# Patient Record
Sex: Male | Born: 1975 | Race: White | Hispanic: No | Marital: Single | Smoking: Current every day smoker
Health system: Southern US, Community
[De-identification: ages and names within clinical notes are randomized; demographics above are authoritative.]

## PROBLEM LIST (undated history)

## (undated) DIAGNOSIS — I10 Essential (primary) hypertension: Secondary | ICD-10-CM

## (undated) HISTORY — PX: FRACTURE SURGERY: SHX138

---

## 2010-08-09 ENCOUNTER — Observation Stay (HOSPITAL_COMMUNITY): Admission: EM | Admit: 2010-08-09 | Discharge: 2010-08-09 | Payer: Self-pay | Admitting: Emergency Medicine

## 2010-08-09 ENCOUNTER — Encounter (INDEPENDENT_AMBULATORY_CARE_PROVIDER_SITE_OTHER): Payer: Self-pay | Admitting: Emergency Medicine

## 2010-08-09 ENCOUNTER — Ambulatory Visit: Payer: Self-pay | Admitting: Internal Medicine

## 2011-02-28 LAB — POCT CARDIAC MARKERS: Troponin i, poc: <0.05 ng/mL (ref 0.00–0.09)

## 2011-02-28 LAB — CBC
HCT: 43.5 % (ref 39.0–52.0)
Hemoglobin: 15 g/dL (ref 13.0–17.0)
MCH: 30.5 pg (ref 26.0–34.0)
MCHC: 34.5 g/dL (ref 30.0–36.0)
MCV: 88.4 fL (ref 78.0–100.0)
Platelets: 206 10*3/uL (ref 150–400)
RBC: 4.92 MIL/uL (ref 4.22–5.81)
RDW: 13.2 % (ref 11.5–15.5)
WBC: 5.9 10*3/uL (ref 4.0–10.5)

## 2011-02-28 LAB — COMPREHENSIVE METABOLIC PANEL
AST: 27 U/L (ref 0–37)
Alkaline Phosphatase: 45 U/L (ref 39–117)
BUN: 4 mg/dL — ABNORMAL LOW (ref 6–23)
CO2: 28 mEq/L (ref 19–32)
Calcium: 8.8 mg/dL (ref 8.4–10.5)
Chloride: 104 mEq/L (ref 96–112)
GFR calc Af Amer: >60 mL/min (ref >60)
Glucose, Bld: 91 mg/dL (ref 70–99)
Sodium: 137 mEq/L (ref 135–145)
Total Bilirubin: 0.3 mg/dL (ref 0.3–1.2)

## 2011-02-28 LAB — DIFFERENTIAL
Basophils Absolute: 0 10*3/uL (ref 0.0–0.1)
Lymphocytes Relative: 31 % (ref 12–46)
Monocytes Absolute: 0.7 10*3/uL (ref 0.1–1.0)

## 2020-05-30 ENCOUNTER — Encounter: Payer: Self-pay | Admitting: Emergency Medicine

## 2020-05-30 ENCOUNTER — Emergency Department (INDEPENDENT_AMBULATORY_CARE_PROVIDER_SITE_OTHER)
Admission: EM | Admit: 2020-05-30 | Discharge: 2020-05-30 | Disposition: A | Discharge status: Home / Self Care | Payer: Managed Care, Other (non HMO) | Source: Home / Self Care

## 2020-05-30 ENCOUNTER — Telehealth: Payer: Self-pay | Admitting: Emergency Medicine

## 2020-05-30 ENCOUNTER — Other Ambulatory Visit: Payer: Self-pay

## 2020-05-30 ENCOUNTER — Emergency Department (INDEPENDENT_AMBULATORY_CARE_PROVIDER_SITE_OTHER): Payer: Managed Care, Other (non HMO)

## 2020-05-30 ENCOUNTER — Encounter: Payer: Self-pay | Admitting: Sports Medicine

## 2020-05-30 DIAGNOSIS — M7021 Olecranon bursitis, right elbow: Secondary | ICD-10-CM

## 2020-05-30 DIAGNOSIS — M25521 Pain in right elbow: Secondary | ICD-10-CM

## 2020-05-30 DIAGNOSIS — M25421 Effusion, right elbow: Secondary | ICD-10-CM | POA: Diagnosis not present

## 2020-05-30 DIAGNOSIS — R030 Elevated blood-pressure reading, without diagnosis of hypertension: Secondary | ICD-10-CM

## 2020-05-30 MED ORDER — MELOXICAM 15 MG PO TBDP: 1.0000 | ORAL_TABLET | Freq: Every day | ORAL | 0 refills | Status: DC | PRN

## 2020-05-30 NOTE — ED Triage Notes (Signed)
Woke up 1 month ago w/ swelling to R elbow- unsure if he hit it or not  OTC meds - Aleve 2 tabs this am at 0830 ROM intact to RUE  No COVID vaccine

## 2020-05-30 NOTE — Discharge Instructions (Signed)
  Call today or tomorrow to schedule a follow up appointment with Sports Medicine for further evaluation and treatment of elbow swelling (bursitis)  Call to schedule a follow up appointment with a primary care provider for monitoring and maintenance of your high blood pressure.   Your blood pressure reading was high today.  That could be due to infection, pain, or anxiety of being in a medical office, however, it is important to follow up with a primary care provider next week for a blood pressure recheck.  If your blood pressure stays elevated and untreated, it can increase your risk of stroke, heart disease including heart attacks, or even kidney failure, which would mean you would need dialysis.  High blood pressure tends to go unnoticed unless you get routine exams as there are typically no side effects until it is too high. High blood pressure can be managed by a low sodium (salt) diet and exercise as well as medication (pills).

## 2020-05-30 NOTE — Telephone Encounter (Signed)
mobic changed to regular tabs not ODT per provider Dario Guardian, PA-C)

## 2020-05-30 NOTE — ED Provider Notes (Signed)
[facility]    CSN: [id number] Arrival date & time: 05/30/20  0936      History   Chief Complaint Chief Complaint  Patient presents with  . Joint Swelling    R elbow    HPI Brandon Hoover is a 44 y.o. male.   HPI  Field Brandon Hoover is a 44 y.o. male presenting to UC with c/o persistent Right elbow swelling and mild soreness that started 1 month ago. 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  He has used an OTC elbow sleeve with padding but no improvement of the swelling.  He has taken Aleve, last dose 830AM.  Denies numbness or weakness in his arm.  Hx of Right elbow surgery many years ago due to a broken arm. He has not had any complications.   BP elevated. He has been told in the past he has high blood pressure but he is not on any BP medications at this time. Denies HA, dizziness or chest pain.    History reviewed. No pertinent past medical history.  There are no problems to display for this patient.   Past Surgical History:  Procedure Laterality Date  . FRACTURE SURGERY Right    elboe w/ pins       Home Medications    Prior to Admission medications   Medication Sig Start Date End Date Taking? Authorizing Provider  Meloxicam 15 MG TBDP Take 1 tablet by mouth daily as needed (pain and swelling). 05/30/20   Noe Gens, PA-C    Family History Family History  Problem Relation Age of Onset  . Healthy Father   . Healthy Brother     Social History Social History   Tobacco Use  . Smoking status: Current Every Day Smoker    Packs/day: 0.50    Years: 25.00    Pack years: 12.50    Types: Cigarettes  . Smokeless tobacco: Never Used  Vaping Use  . Vaping Use: Never used  Substance Use Topics  . Alcohol use: Yes    Alcohol/week: 4.0 standard drinks    Types: 4 Standard drinks or equivalent per week  . Drug use: Never     Allergies   Patient has no known allergies.   Review of  Systems Review of Systems  Constitutional: Negative for chills and fever.  Musculoskeletal: Positive for arthralgias and joint swelling.  Skin: Negative for color change and wound.     Physical Exam Triage Vital Signs ED Triage Vitals  Enc Vitals Group     BP 05/30/20 0951 (!) 175/101     Pulse Rate 05/30/20 0951 77     Resp 05/30/20 0951 17     Temp 05/30/20 0951 98.9 F (37.2 C)     Temp Source 05/30/20 0951 Oral     SpO2 05/30/20 0951 100 %     Weight 05/30/20 0955 230 lb (104.3 kg)     Height 05/30/20 0955 6\' 1"  (1.854 m)     Head Circumference --      Peak Flow --      Pain Score 05/30/20 0955 2     Pain Loc --      Pain Edu? --      Excl. in [place]? --    No data found.  Updated Vital Signs BP (!) 151/95 (BP Location: Left Arm)   Pulse 88   Temp 98.9 F (37.2 C) (Oral)  Resp 17   Ht 6\' 1"  (1.854 m)   Wt 230 lb (104.3 kg)   SpO2 100%   BMI 30.34 kg/m   Visual Acuity Right Eye Distance:   Left Eye Distance:   Bilateral Distance:    Right Eye Near:   Left Eye Near:    Bilateral Near:     Physical Exam Vitals and nursing note reviewed.  Constitutional:      Appearance: Normal appearance. He is well-developed.  HENT:     Head: Normocephalic and atraumatic.  Cardiovascular:     Rate and Rhythm: Normal rate and regular rhythm.     Pulses:          Radial pulses are 2+ on the right side.  Pulmonary:     Effort: Pulmonary effort is normal.  Musculoskeletal:        General: Swelling and tenderness present. Normal range of motion.     Cervical back: Normal range of motion.     Comments: Right elbow: moderate to significant edema of olecranon bursa. Mildly tender. Full ROM elbow. No crepitus. 5/5 strength in Right arm  Skin:    General: Skin is warm and dry.     Capillary Refill: Capillary refill takes less than 2 seconds.     Findings: No bruising or erythema.     Comments: Right elbow: well healed surgical scar along lateral epicondyle   Neurological:      Mental Status: He is alert and oriented to person, place, and time.     Sensory: No sensory deficit.  Psychiatric:        Behavior: Behavior normal.      UC Treatments / Results  Labs (all labs ordered are listed, but only abnormal results are displayed) Labs Reviewed - No data to display  EKG   Radiology DG ELBOW COMPLETE RIGHT (3+VIEW)  Result Date: 05/30/2020 CLINICAL DATA:  Right elbow pain/swelling, injury at work 1 month ago EXAM: RIGHT ELBOW - COMPLETE 3+ VIEW COMPARISON:  None. FINDINGS: No fracture or dislocation is seen. The joint spaces are preserved. No displaced elbow joint fat pads suggest an elbow joint effusion. Soft tissue swelling overlying the olecranon on the lateral view, suggesting olecranon bursitis. IMPRESSION: Soft tissue swelling overlying the olecranon on the lateral view, suggesting olecranon bursitis. Electronically Signed   By: 06/01/2020 M.D.   On: 05/30/2020 10:33    Procedures Procedures (including critical care time)  Medications Ordered in UC Medications - No data to display  Initial Impression / Assessment and Plan / UC Course  I have reviewed the triage vital signs and the nursing notes.  Pertinent labs & imaging results that were available during my care of the patient were reviewed by me and considered in my medical decision making (see chart for details).     Olecranon bursitis w/o complication Encouraged f/u with  Sports Medicine for elbow and PCP for BP management AVS provided  Final Clinical Impressions(s) / UC Diagnoses   Final diagnoses:  Olecranon bursitis of right elbow  Elevated blood pressure reading     Discharge Instructions      Call today or tomorrow to schedule a follow up appointment with Sports Medicine for further evaluation and treatment of elbow swelling (bursitis)  Call to schedule a follow up appointment with a primary care provider for monitoring and maintenance of your high blood pressure.     Your blood pressure reading was high today.  That could be due to infection, pain, or  anxiety of being in a medical office, however, it is important to follow up with a primary care provider next week for a blood pressure recheck.  If your blood pressure stays elevated and untreated, it can increase your risk of stroke, heart disease including heart attacks, or even kidney failure, which would mean you would need dialysis.  High blood pressure tends to go unnoticed unless you get routine exams as there are typically no side effects until it is too high. High blood pressure can be managed by a low sodium (salt) diet and exercise as well as medication (pills).     ED Prescriptions    Medication Sig Dispense Auth. Provider   Meloxicam 15 MG TBDP Take 1 tablet by mouth daily as needed (pain and swelling). 30 tablet Lurene Shadow, [place]     PDMP not reviewed this encounter.   Lurene Shadow, [place] 05/30/20 1132

## 2020-06-05 ENCOUNTER — Ambulatory Visit (INDEPENDENT_AMBULATORY_CARE_PROVIDER_SITE_OTHER): Payer: Managed Care, Other (non HMO) | Admitting: Sports Medicine

## 2020-06-05 DIAGNOSIS — M7021 Olecranon bursitis, right elbow: Secondary | ICD-10-CM

## 2020-06-05 NOTE — Assessment & Plan Note (Signed)
This is a pleasant 44 year old male, he has a classic olecranon bursitis, right elbow. The appearance does bother him, he does have some discomfort as well, but does have history of a right elbow ORIF. Today we aspirated, injected the olecranon bursitis, return to see me in a month.

## 2020-06-05 NOTE — Progress Notes (Signed)
° ° °  Procedures performed today:    Procedure: Real-time Ultrasound Guided aspiration/injection of right olecranon bursa Device: Samsung HS60  Verbal informed consent obtained.  Time-out conducted.  Noted no overlying erythema, induration, or other signs of local infection.  Skin prepped in a sterile fashion.  Local anesthesia: Topical Ethyl chloride.  With sterile technique and under real time ultrasound guidance:  Using an 18-gauge needle aspirated approximately 10 mL of serosanguineous fluid, syringe switched and 1 cc Kenalog 40 injected easily  completed without difficulty  Pain immediately resolved suggesting accurate placement of the medication.  Advised to call if fevers/chills, erythema, induration, drainage, or persistent bleeding.  Images permanently stored and available for review in the ultrasound unit.  Impression: Technically successful ultrasound guided injection.  The elbow was then strapped with a compressive dressing.  Independent interpretation of notes and tests performed by another provider:   None.  Brief History, Exam, Impression, and Recommendations:    Olecranon bursitis, right elbow This is a pleasant 44 year old male, he has a classic olecranon bursitis, right elbow. The appearance does bother him, he does have some discomfort as well, but does have history of a right elbow ORIF. Today we aspirated, injected the olecranon bursitis, return to see me in a month.    ___________________________________________ Ihor Austin. Benjamin Stain, M.D., ABFM., CAQSM. Primary Care and Sports Medicine [facility]  Adjunct Instructor of Family Medicine  [employer]

## 2020-06-07 ENCOUNTER — Encounter: Payer: Self-pay | Admitting: Family Medicine

## 2020-06-07 ENCOUNTER — Other Ambulatory Visit: Payer: Self-pay

## 2020-06-07 ENCOUNTER — Ambulatory Visit (INDEPENDENT_AMBULATORY_CARE_PROVIDER_SITE_OTHER): Payer: Managed Care, Other (non HMO) | Admitting: Family Medicine

## 2020-06-07 VITALS — BP 151/99 | HR 78 | Temp 97.9°F | Ht 72.05 in | Wt 231.0 lb

## 2020-06-07 DIAGNOSIS — I10 Essential (primary) hypertension: Secondary | ICD-10-CM | POA: Diagnosis not present

## 2020-06-07 MED ORDER — OLMESARTAN MEDOXOMIL 20 MG PO TABS: 20.0000 mg | ORAL_TABLET | Freq: Every day | ORAL | 1 refills | Status: DC

## 2020-06-07 NOTE — Progress Notes (Signed)
Brandon Hoover - 44 y.o. male MRN [id number]  Date of birth: 03/17/1976  Subjective Chief Complaint  Patient presents with  . Establish Care  . Hypertension    HPI Brandon Hoover is a  44 y.o. male here today for initial visit.  He was seen at urgent care recently for swelling of elbow and noted to have elevated BP.  He was referred to sports medicine where he was seen by Dr. Darene Lamer and had elbow drained.  He was also advised to follow up with primary care to discuss elevated BP.  HE denies any symptoms related to HTN including chest pain, shortness of breath, palpitations, headache or vision changes.    He is a current smoker, smokes about 1ppd.  He also consumes EtOH a few days per week.  He does add salt when cooking.  He denies increased stress or anxiety.    ROS:  A comprehensive ROS was completed and negative except as noted per HPI  No Known Allergies  History reviewed. No pertinent past medical history.  Past Surgical History:  Procedure Laterality Date  . FRACTURE SURGERY Right    elboe w/ pins    Social History   Socioeconomic History  . Marital status: Single    Spouse name: Not on file  . Number of children: Not on file  . Years of education: Not on file  . Highest education level: Not on file  Occupational History  . Not on file  Tobacco Use  . Smoking status: Current Every Day Smoker    Packs/day: 0.50    Years: 25.00    Pack years: 12.50    Types: Cigarettes  . Smokeless tobacco: Never Used  Vaping Use  . Vaping Use: Never used  Substance and Sexual Activity  . Alcohol use: Yes    Alcohol/week: 4.0 standard drinks    Types: 4 Standard drinks or equivalent per week  . Drug use: Never  . Sexual activity: Yes  Other Topics Concern  . Not on file  Social History Narrative  . Not on file   Social Determinants of Health   Financial Resource Strain:   . Difficulty of Paying Living Expenses:   Food Insecurity:   . Worried About Charity fundraiser in the Last  Year:   . Arboriculturist in the Last Year:   Transportation Needs:   . Film/video editor (Medical):   Marland Kitchen Lack of Transportation (Non-Medical):   Physical Activity:   . Days of Exercise per Week:   . Minutes of Exercise per Session:   Stress:   . Feeling of Stress :   Social Connections:   . Frequency of Communication with Friends and Family:   . Frequency of Social Gatherings with Friends and Family:   . Attends Religious Services:   . Active Member of Clubs or Organizations:   . Attends Archivist Meetings:   Marland Kitchen Marital Status:     Family History  Problem Relation Age of Onset  . Healthy Father   . Healthy Brother     Health Maintenance  Topic Date Due  . Hepatitis C Screening  Never done  . COVID-19 Vaccine (1) Never done  . HIV Screening  Never done  . TETANUS/TDAP  Never done  . INFLUENZA VACCINE  07/15/2020     ----------------------------------------------------------------------------------------------------------------------------------------------------------------------------------------------------------------- Physical Exam BP (!) 151/99 (BP Location: Left Arm, Patient Position: Sitting, Cuff Size: Large)   Pulse 78   Temp 97.9 F (36.6 C)  Ht 6' 0.05" (1.83 m)   Wt 231 lb (104.8 kg)   SpO2 97%   BMI 31.29 kg/m   Physical Exam Constitutional:      Appearance: Normal appearance.  HENT:     Head: Normocephalic and atraumatic.  Eyes:     General: No scleral icterus. Cardiovascular:     Rate and Rhythm: Normal rate and regular rhythm.  Pulmonary:     Effort: Pulmonary effort is normal.     Breath sounds: Normal breath sounds.  Musculoskeletal:     Cervical back: Neck supple.  Skin:    General: Skin is warm and dry.  Neurological:     General: No focal deficit present.     Mental Status: He is alert.  Psychiatric:        Mood and Affect: Mood normal.      ------------------------------------------------------------------------------------------------------------------------------------------------------------------------------------------------------------------- Assessment and Plan  Essential hypertension New diagnosis.  Discussed lifestyle change to help with BP control including lowering sodium intake and quitting smoking.  Start benicar 20mg  daily and cmp/cbc ordered.  Follow up in 6 weeks.    Meds ordered this encounter  Medications  . olmesartan (BENICAR) 20 MG tablet    Sig: Take 1 tablet (20 mg total) by mouth daily.    Dispense:  90 tablet    Refill:  1    Return in about 6 weeks (around 07/19/2020) for HTN.    This visit occurred during the SARS-CoV-2 public health emergency.  Safety protocols were in place, including screening questions prior to the visit, additional usage of staff PPE, and extensive cleaning of exam room while observing appropriate contact time as indicated for disinfecting solutions.

## 2020-06-07 NOTE — Patient Instructions (Signed)

## 2020-06-07 NOTE — Assessment & Plan Note (Signed)
New diagnosis.  Discussed lifestyle change to help with BP control including lowering sodium intake and quitting smoking.  Start benicar 20mg  daily and cmp/cbc ordered.  Follow up in 6 weeks.

## 2020-06-08 LAB — COMPLETE METABOLIC PANEL WITH GFR
AG Ratio: 2 (calc) (ref 1.0–2.5)
ALT: 14 U/L (ref 9–46)
AST: 18 U/L (ref 10–40)
Albumin: 4.5 g/dL (ref 3.6–5.1)
Alkaline phosphatase (APISO): 60 U/L (ref 36–130)
BUN: 16 mg/dL (ref 7–25)
CO2: 27 mmol/L (ref 20–32)
Calcium: 9.7 mg/dL (ref 8.6–10.3)
Chloride: 105 mmol/L (ref 98–110)
Creat: 0.77 mg/dL (ref 0.60–1.35)
GFR, Est African American: 130 mL/min/{1.73_m2} (ref >=60)
GFR, Est Non African American: 112 mL/min/{1.73_m2} (ref >=60)
Globulin: 2.2 g/dL (calc) (ref 1.9–3.7)
Glucose, Bld: 85 mg/dL (ref 65–99)
Potassium: 4.7 mmol/L (ref 3.5–5.3)
Sodium: 139 mmol/L (ref 135–146)
Total Bilirubin: 0.3 mg/dL (ref 0.2–1.2)
Total Protein: 6.7 g/dL (ref 6.1–8.1)

## 2020-06-08 LAB — CBC WITH DIFFERENTIAL/PLATELET
Absolute Monocytes: 854 cells/uL (ref 200–950)
Basophils Absolute: 18 cells/uL (ref 0–200)
Basophils Relative: 0.2 %
Eosinophils Absolute: 27 cells/uL (ref 15–500)
Eosinophils Relative: 0.3 %
HCT: 44.4 % (ref 38.5–50.0)
Hemoglobin: 15.7 g/dL (ref 13.2–17.1)
Lymphs Abs: 1727 cells/uL (ref 850–3900)
MCH: 32.1 pg (ref 27.0–33.0)
MCHC: 35.4 g/dL (ref 32.0–36.0)
MCV: 90.8 fL (ref 80.0–100.0)
MPV: 10.1 fL (ref 7.5–12.5)
Monocytes Relative: 9.6 %
Neutro Abs: 6275 cells/uL (ref 1500–7800)
Neutrophils Relative %: 70.5 %
Platelets: 246 10*3/uL (ref 140–400)
RBC: 4.89 10*6/uL (ref 4.20–5.80)
RDW: 12.6 % (ref 11.0–15.0)
Total Lymphocyte: 19.4 %
WBC: 8.9 10*3/uL (ref 3.8–10.8)

## 2020-07-03 ENCOUNTER — Encounter: Payer: Self-pay | Admitting: Family Medicine

## 2020-07-03 ENCOUNTER — Ambulatory Visit (INDEPENDENT_AMBULATORY_CARE_PROVIDER_SITE_OTHER): Payer: Managed Care, Other (non HMO) | Admitting: Sports Medicine

## 2020-07-03 ENCOUNTER — Ambulatory Visit (INDEPENDENT_AMBULATORY_CARE_PROVIDER_SITE_OTHER): Payer: Managed Care, Other (non HMO) | Admitting: Family Medicine

## 2020-07-03 DIAGNOSIS — M7021 Olecranon bursitis, right elbow: Secondary | ICD-10-CM

## 2020-07-03 DIAGNOSIS — R1013 Epigastric pain: Secondary | ICD-10-CM | POA: Diagnosis not present

## 2020-07-03 DIAGNOSIS — I10 Essential (primary) hypertension: Secondary | ICD-10-CM

## 2020-07-03 MED ORDER — OLMESARTAN MEDOXOMIL 20 MG PO TABS: 10.0000 mg | ORAL_TABLET | Freq: Every day | ORAL | 1 refills | Status: AC

## 2020-07-03 MED ORDER — PANTOPRAZOLE SODIUM 40 MG PO TBEC: DELAYED_RELEASE_TABLET | ORAL | 3 refills | Status: AC

## 2020-07-03 NOTE — Patient Instructions (Addendum)
Cut back to 1/2 tab of the olmesartan.  Start pantoprazole twice per day for 1 month then reduce to once per day.  Stay away from goody powders.  See me again in about 2 months.

## 2020-07-03 NOTE — Assessment & Plan Note (Addendum)
This is a very pleasant 44 year old male, at the last visit we aspirated and injected a right olecranon bursitis, he returns today with symptoms essentially completely resolved, he will continue to wear elbow protection when working, return as needed.

## 2020-07-03 NOTE — Progress Notes (Signed)
    Procedures performed today:    None.  Independent interpretation of notes and tests performed by another provider:   None.  Brief History, Exam, Impression, and Recommendations:    Olecranon bursitis, right elbow This is a very pleasant 44 year old male, at the last visit we aspirated and injected a right olecranon bursitis, he returns today with symptoms essentially completely resolved, he will continue to wear elbow protection when working, return as needed.    ___________________________________________ Ihor Austin. Benjamin Stain, M.D., ABFM., CAQSM. Primary Care and Sports Medicine [facility]  Adjunct Instructor of Family Medicine  [employer]

## 2020-07-03 NOTE — Assessment & Plan Note (Signed)
BP improved today.  It sounds like olmesartan 20mg  may be too high for him.  Will reduce to 1/2 tab daily.  He will continue lifestyle change with sodium reduction.  Encouraged smoking cessation as well.

## 2020-07-03 NOTE — Assessment & Plan Note (Signed)
Symptoms concerning for peptic ulcer.  Will start protonix bid x4 weeks then reduce to daily.  He will avoid goody powder and other NSAIDS for now to allow for healing.  We discussed red flags including worsening pain, nausea, hematemesis or dark stools.

## 2020-07-03 NOTE — Progress Notes (Signed)
Brandon Hoover - 44 y.o. male MRN [id number]  Date of birth: May 21, 1976  Subjective Chief Complaint  Patient presents with  . Hypertension    HPI Brandon Hoover is a 44 y.o. male with history of HTN here today for follow up visit.  He was started on olmesartan 20mg  at last visit.  He has taken a few times but states that he feels funny and a little dizzy after taking.  He last too about 1 week ago.  He has been working on reduction of salt intake.  He denies symptoms related to HTN including chest pain, shortness of breath, palpitations., headache or vision changes.   He also reports some recent epigastric pain.  Describes as burning sensation.  Had some red material in stool last week but thinks this is from red velvet cake.  He has not seen anything since then.  He admits to using goody powders frequently, he has cut back on these recently.    ROS:  A comprehensive ROS was completed and negative except as noted per HPI  No Known Allergies  History reviewed. No pertinent past medical history.  Past Surgical History:  Procedure Laterality Date  . FRACTURE SURGERY Right    elboe w/ pins    Social History   Socioeconomic History  . Marital status: Single    Spouse name: Not on file  . Number of children: Not on file  . Years of education: Not on file  . Highest education level: Not on file  Occupational History  . Not on file  Tobacco Use  . Smoking status: Current Every Day Smoker    Packs/day: 0.50    Years: 25.00    Pack years: 12.50    Types: Cigarettes  . Smokeless tobacco: Never Used  Vaping Use  . Vaping Use: Never used  Substance and Sexual Activity  . Alcohol use: Yes    Alcohol/week: 4.0 standard drinks    Types: 4 Standard drinks or equivalent per week  . Drug use: Never  . Sexual activity: Yes  Other Topics Concern  . Not on file  Social History Narrative  . Not on file   Social Determinants of Health   Financial Resource Strain:   . Difficulty of Paying  Living Expenses:   Food Insecurity:   . Worried About in the Last Year:   . Programme researcher, broadcasting/film/video in the Last Year:   Transportation Needs:   . Barista (Medical):   Freight forwarder Lack of Transportation (Non-Medical):   Physical Activity:   . Days of Exercise per Week:   . Minutes of Exercise per Session:   Stress:   . Feeling of Stress :   Social Connections:   . Frequency of Communication with Friends and Family:   . Frequency of Social Gatherings with Friends and Family:   . Attends Religious Services:   . Active Member of Clubs or Organizations:   . Attends Marland Kitchen Meetings:   Banker Marital Status:     Family History  Problem Relation Age of Onset  . Healthy Father   . Healthy Brother     Health Maintenance  Topic Date Due  . Hepatitis C Screening  Never done  . COVID-19 Vaccine (1) Never done  . HIV Screening  Never done  . TETANUS/TDAP  Never done  . INFLUENZA VACCINE  07/15/2020     ----------------------------------------------------------------------------------------------------------------------------------------------------------------------------------------------------------------- Physical Exam BP 136/87 (BP Location: Left Arm, Patient Position: Sitting, Cuff  Size: Normal)   Pulse 88   Ht 6\' 1"  (1.854 m)   Wt 230 lb (104.3 kg)   SpO2 98%   BMI 30.34 kg/m   Physical Exam Constitutional:      Appearance: Normal appearance.  HENT:     Head: Normocephalic and atraumatic.  Eyes:     General: No scleral icterus. Cardiovascular:     Rate and Rhythm: Normal rate and regular rhythm.  Pulmonary:     Effort: Pulmonary effort is normal.     Breath sounds: Normal breath sounds.  Abdominal:     General: Abdomen is flat. There is no distension.     Palpations: Abdomen is soft.     Tenderness: There is no abdominal tenderness.  Musculoskeletal:     Cervical back: Neck supple.  Skin:    General: Skin is warm.  Neurological:      General: No focal deficit present.     Mental Status: He is alert.  Psychiatric:        Mood and Affect: Mood normal.        Behavior: Behavior normal.     ------------------------------------------------------------------------------------------------------------------------------------------------------------------------------------------------------------------- Assessment and Plan  Epigastric pain Symptoms concerning for peptic ulcer.  Will start protonix bid x4 weeks then reduce to daily.  He will avoid goody powder and other NSAIDS for now to allow for healing.  We discussed red flags including worsening pain, nausea, hematemesis or dark stools.   Essential hypertension BP improved today.  It sounds like olmesartan 20mg  may be too high for him.  Will reduce to 1/2 tab daily.  He will continue lifestyle change with sodium reduction.  Encouraged smoking cessation as well.    Meds ordered this encounter  Medications  . olmesartan (BENICAR) 20 MG tablet    Sig: Take 0.5 tablets (10 mg total) by mouth daily.    Dispense:  90 tablet    Refill:  1  . pantoprazole (PROTONIX) 40 MG tablet    Sig: Take Bid x4 weeks then reduce to once daily    Dispense:  60 tablet    Refill:  3    Return in about 2 months (around 09/03/2020) for HTN.    This visit occurred during the SARS-CoV-2 public health emergency.  Safety protocols were in place, including screening questions prior to the visit, additional usage of staff PPE, and extensive cleaning of exam room while observing appropriate contact time as indicated for disinfecting solutions.

## 2020-09-03 ENCOUNTER — Ambulatory Visit: Payer: Managed Care, Other (non HMO) | Admitting: Family Medicine

## 2021-05-19 ENCOUNTER — Emergency Department (HOSPITAL_BASED_OUTPATIENT_CLINIC_OR_DEPARTMENT_OTHER)
Admission: EM | Admit: 2021-05-19 | Discharge: 2021-05-19 | Disposition: A | Discharge status: Home / Self Care | Payer: 59 | Attending: Emergency Medicine | Admitting: Emergency Medicine

## 2021-05-19 ENCOUNTER — Other Ambulatory Visit: Payer: Self-pay

## 2021-05-19 ENCOUNTER — Emergency Department (HOSPITAL_BASED_OUTPATIENT_CLINIC_OR_DEPARTMENT_OTHER): Payer: 59

## 2021-05-19 ENCOUNTER — Encounter (HOSPITAL_BASED_OUTPATIENT_CLINIC_OR_DEPARTMENT_OTHER): Payer: Self-pay

## 2021-05-19 DIAGNOSIS — F17210 Nicotine dependence, cigarettes, uncomplicated: Secondary | ICD-10-CM | POA: Diagnosis not present

## 2021-05-19 DIAGNOSIS — Z23 Encounter for immunization: Secondary | ICD-10-CM | POA: Insufficient documentation

## 2021-05-19 DIAGNOSIS — I10 Essential (primary) hypertension: Secondary | ICD-10-CM | POA: Insufficient documentation

## 2021-05-19 DIAGNOSIS — Z79899 Other long term (current) drug therapy: Secondary | ICD-10-CM | POA: Insufficient documentation

## 2021-05-19 DIAGNOSIS — W3400XA Accidental discharge from unspecified firearms or gun, initial encounter: Secondary | ICD-10-CM | POA: Diagnosis not present

## 2021-05-19 DIAGNOSIS — S61216A Laceration without foreign body of right little finger without damage to nail, initial encounter: Secondary | ICD-10-CM | POA: Diagnosis not present

## 2021-05-19 DIAGNOSIS — S61206A Unspecified open wound of right little finger without damage to nail, initial encounter: Secondary | ICD-10-CM | POA: Diagnosis present

## 2021-05-19 MED ORDER — TETANUS-DIPHTH-ACELL PERTUSSIS 5-2.5-18.5 LF-MCG/0.5 IM SUSY
0.5000 mL | PREFILLED_SYRINGE | Freq: Once | INTRAMUSCULAR | Status: AC
  Administered 2021-05-19: 0.5 mL via INTRAMUSCULAR
  Filled 2021-05-19: qty 0.5
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
## 2021-05-19 NOTE — ED Notes (Signed)
EDP at bedside  
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
## 2021-05-20 ENCOUNTER — Telehealth: Payer: Self-pay | Admitting: General Practice

## 2021-05-20 NOTE — Telephone Encounter (Signed)
Transition Care Management Unsuccessful Follow-up Telephone Call  Date of discharge and from where:  Time 05/19/21  Attempts:  1st Attempt  Reason for unsuccessful TCM follow-up call:  Left voice message

## 2021-05-21 NOTE — Telephone Encounter (Signed)
Transition Care Management Follow-up Telephone Call  Date of discharge and from where: 05/19/2021 from [facility]  How have you been since you were released from the hospital? Pt stated that he is feeling well and has no questions or concerns.   Any questions or concerns? No  Items Reviewed:  Did the pt receive and understand the discharge instructions provided? Yes   Medications obtained and verified? Yes   Other? No   Any new allergies since your discharge? No   Dietary orders reviewed? n/a  Do you have support at home? Yes   Functional Questionnaire: (I = Independent and D = Dependent) ADLs: I  Bathing/Dressing- I  Meal Prep- I  Eating- I  Maintaining continence- I  Transferring/Ambulation- I  Managing Meds- I  Follow up appointments reviewed:   PCP Hospital f/u appt confirmed? No    Specialist Hospital f/u appt confirmed? No    Are transportation arrangements needed? No   If their condition worsens, is the pt aware to call PCP or go to the Emergency Dept.? Yes  Was the patient provided with contact information for the PCP's office or ED? Yes  Was to pt encouraged to call back with questions or concerns? Yes

## 2021-07-09 ENCOUNTER — Other Ambulatory Visit: Payer: Self-pay | Admitting: Family Medicine

## 2022-04-05 ENCOUNTER — Telehealth: Payer: Self-pay | Admitting: Family Medicine

## 2022-04-05 ENCOUNTER — Emergency Department
Admission: RE | Admit: 2022-04-05 | Discharge: 2022-04-05 | Disposition: A | Discharge status: Home / Self Care | Payer: No Typology Code available for payment source | Source: Ambulatory Visit

## 2022-04-05 VITALS — BP 149/100 | HR 78 | Temp 97.6°F | Resp 20 | Ht 74.0 in | Wt 230.0 lb

## 2022-04-05 DIAGNOSIS — I10 Essential (primary) hypertension: Secondary | ICD-10-CM | POA: Diagnosis not present

## 2022-04-05 DIAGNOSIS — Z760 Encounter for issue of repeat prescription: Secondary | ICD-10-CM

## 2022-04-05 HISTORY — DX: Essential (primary) hypertension: I10

## 2022-04-05 LAB — CBC WITH DIFFERENTIAL/PLATELET
Absolute Monocytes: 809 cells/uL (ref 200–950)
Basophils Absolute: 41 cells/uL (ref 0–200)
Basophils Relative: 0.6 %
Eosinophils Absolute: 184 cells/uL (ref 15–500)
Eosinophils Relative: 2.7 %
HCT: 45.9 % (ref 38.5–50.0)
Hemoglobin: 15.7 g/dL (ref 13.2–17.1)
Lymphs Abs: 1462 cells/uL (ref 850–3900)
MCH: 31.2 pg (ref 27.0–33.0)
MCHC: 34.2 g/dL (ref 32.0–36.0)
MCV: 91.1 fL (ref 80.0–100.0)
MPV: 9.4 fL (ref 7.5–12.5)
Monocytes Relative: 11.9 %
Neutro Abs: 4304 cells/uL (ref 1500–7800)
Neutrophils Relative %: 63.3 %
Platelets: 236 10*3/uL (ref 140–400)
RBC: 5.04 10*6/uL (ref 4.20–5.80)
RDW: 12.4 % (ref 11.0–15.0)
Total Lymphocyte: 21.5 %
WBC: 6.8 10*3/uL (ref 3.8–10.8)

## 2022-04-05 LAB — COMPLETE METABOLIC PANEL WITH GFR
AG Ratio: 1.6 (calc) (ref 1.0–2.5)
ALT: 14 U/L (ref 9–46)
AST: 13 U/L (ref 10–40)
Albumin: 4.2 g/dL (ref 3.6–5.1)
Alkaline phosphatase (APISO): 52 U/L (ref 36–130)
BUN: 8 mg/dL (ref 7–25)
CO2: 31 mmol/L (ref 20–32)
Calcium: 9.2 mg/dL (ref 8.6–10.3)
Chloride: 102 mmol/L (ref 98–110)
Creat: 0.77 mg/dL (ref 0.60–1.29)
Globulin: 2.6 g/dL (calc) (ref 1.9–3.7)
Glucose, Bld: 65 mg/dL (ref 65–99)
Potassium: 4.2 mmol/L (ref 3.5–5.3)
Sodium: 137 mmol/L (ref 135–146)
Total Bilirubin: 0.4 mg/dL (ref 0.2–1.2)
Total Protein: 6.8 g/dL (ref 6.1–8.1)
eGFR: 113 mL/min/{1.73_m2} (ref >=60)

## 2022-04-05 MED ORDER — OLMESARTAN MEDOXOMIL 20 MG PO TABS: 10.0000 mg | ORAL_TABLET | Freq: Every day | ORAL | 1 refills | Status: AC

## 2022-04-05 MED ORDER — OLMESARTAN MEDOXOMIL 20 MG PO TABS: 20.0000 mg | ORAL_TABLET | Freq: Every day | ORAL | 1 refills | Status: AC

## 2022-04-05 NOTE — ED Notes (Signed)
Stat lab pickup requested of [facility] for pt's labs. Confirmation number [id number].  ?
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
## 2022-04-05 NOTE — ED Provider Notes (Signed)
?[facility] ? ? ? ?CSN: [id number] ?Arrival date & time: 04/05/22  0845 ? ? ?  ? ?History   ?Chief Complaint ?Chief Complaint  ?Patient presents with  ? Medication Refill  ? Hypertension  ? ? ?HPI ?Brandon Hoover is a 46 y.o. male.  ? ?HPI 46 year old male presents with request for blood pressure medication refill.  PMH significant for HTN and epigastric pain. ? ?Past Medical History:  ?Diagnosis Date  ? Hypertension   ? ? ?Patient Active Problem List  ? Diagnosis Date Noted  ? Epigastric pain 07/03/2020  ? Essential hypertension 06/07/2020  ? Olecranon bursitis, right elbow 06/05/2020  ? ? ?Past Surgical History:  ?Procedure Laterality Date  ? FRACTURE SURGERY Right   ? elboe w/ pins  ? ? ? ? ? ?Home Medications   ? ?Prior to Admission medications   ?Medication Sig Start Date End Date Taking? Authorizing Provider  ?olmesartan (BENICAR) 20 MG tablet Take 0.5 tablets (10 mg total) by mouth daily. 07/03/20   Everrett Coombe, DO  ?pantoprazole (PROTONIX) 40 MG tablet Take Bid x4 weeks then reduce to once daily 07/03/20   Everrett Coombe, DO  ? ? ?Family History ?Family History  ?Problem Relation Age of Onset  ? Healthy Father   ? Healthy Brother   ? ? ?Social History ?Social History  ? ?Tobacco Use  ? Smoking status: Every Day  ?  Packs/day: 0.50  ?  Years: 25.00  ?  Pack years: 12.50  ?  Types: Cigarettes  ? Smokeless tobacco: Never  ?Vaping Use  ? Vaping Use: Never used  ?Substance Use Topics  ? Alcohol use: Not Currently  ?  Comment: occasionally  ? Drug use: Never  ? ? ? ?Allergies   ?Patient has no known allergies. ? ? ?Review of Systems ?Review of Systems  ?All other systems reviewed and are negative. ? ? ?Physical Exam ?Triage Vital Signs ?ED Triage Vitals  ?Enc Vitals Group  ?   BP --   ?   Pulse --   ?   Resp --   ?   Temp --   ?   Temp src --   ?   SpO2 --   ?   Weight 04/05/22 0900 230 lb (104.3 kg)  ?   Height 04/05/22 0900 6\' 2"  (1.88 m)  ?   Head Circumference --   ?   Peak Flow --   ?   Pain Score  04/05/22 0859 0  ?   Pain Loc --   ?   Pain Edu? --   ?   Excl. in GC? --   ? ?No data found. ? ?Updated Vital Signs ?BP (!) 149/100 (BP Location: Left Arm)   Pulse 78   Temp 97.6 ?F (36.4 ?C) (Oral)   Resp 20   Ht 6\' 2"  (1.88 m)   Wt 230 lb (104.3 kg)   SpO2 100%   BMI 29.53 kg/m?  ? ?   ? ?Physical Exam ?Vitals and nursing note reviewed.  ?Constitutional:   ?   General: He is not in acute distress. ?   Appearance: Normal appearance. He is normal weight. He is not ill-appearing.  ?HENT:  ?   Head: Normocephalic and atraumatic.  ?   Mouth/Throat:  ?   Mouth: Mucous membranes are moist.  ?   Pharynx: Oropharynx is clear.  ?Eyes:  ?   Extraocular Movements: Extraocular movements intact.  ?   Conjunctiva/sclera: Conjunctivae normal.  ?  Pupils: Pupils are equal, round, and reactive to light.  ?Cardiovascular:  ?   Rate and Rhythm: Normal rate and regular rhythm.  ?   Pulses: Normal pulses.  ?   Heart sounds: Normal heart sounds.  ?Pulmonary:  ?   Effort: Pulmonary effort is normal.  ?   Breath sounds: No wheezing or rhonchi.  ?Musculoskeletal:  ?   Cervical back: Normal range of motion and neck supple. No tenderness.  ?Lymphadenopathy:  ?   Cervical: No cervical adenopathy.  ?Skin: ?   General: Skin is warm and dry.  ?Neurological:  ?   General: No focal deficit present.  ?   Mental Status: He is alert and oriented to person, place, and time.  ? ? ? ?UC Treatments / Results  ?Labs ?(all labs ordered are listed, but only abnormal results are displayed) ?Labs Reviewed  ?CBC WITH DIFFERENTIAL/PLATELET  ?COMPLETE METABOLIC PANEL WITH GFR  ? ? ?EKG ? ? ?Radiology ?No results found. ? ?Procedures ?Procedures (including critical care time) ? ?Medications Ordered in UC ?Medications - No data to display ? ?Initial Impression / Assessment and Plan / UC Course  ?I have reviewed the triage vital signs and the nursing notes. ? ?Pertinent labs & imaging results that were available during my care of the patient were reviewed  by me and considered in my medical decision making (see chart for details). ? ?  ?MDM: 1. Essential hypertension-CBC with differential, CMP ordered; 2.  Patient request Benicar 10 mg daily to be refilled for current CDL requirements and blood pressure has not been controlled through lifestyle changes. Advised patient we will follow-up with lab results once returned.  Assuming all is well with CMP and CBC we will send medication to requested pharmacy.  Patient discharged home, hemodynamically stable. ?Final Clinical Impressions(s) / UC Diagnoses  ? ?Final diagnoses:  ?Medication refill  ?Essential hypertension  ? ? ? ?Discharge Instructions   ? ?  ?Advised patient we will follow-up with lab results once returned.  Assuming all is well with CMP and CBC we will send medication to requested pharmacy. ? ? ? ? ?ED Prescriptions   ?None ?  ? ?PDMP not reviewed this encounter. ?  ?Trevor Iha, FNP ?04/05/22 [phone] ? ?

## 2022-04-05 NOTE — Discharge Instructions (Addendum)
Advised patient we will follow-up with lab results once returned.  Assuming all is well with CMP and CBC we will send medication to requested pharmacy. ?

## 2022-04-05 NOTE — Telephone Encounter (Signed)
Patient's CMP and CBC were within normal limits patient called and advised blood pressure medication has been sent to pharmacy requested for the next 6 months. ?
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
